# Patient Record
Sex: Male | Born: 1975 | Hispanic: Yes | Marital: Married | Smoking: Never smoker
Health system: Southern US, Community
[De-identification: ages and names within clinical notes are randomized; demographics above are authoritative.]

## PROBLEM LIST (undated history)

## (undated) DIAGNOSIS — E785 Hyperlipidemia, unspecified: Secondary | ICD-10-CM

## (undated) HISTORY — PX: APPENDECTOMY: SHX54

---

## 2004-04-07 ENCOUNTER — Ambulatory Visit: Payer: Self-pay | Admitting: Internal Medicine

## 2006-05-22 ENCOUNTER — Emergency Department: Payer: Self-pay | Admitting: Emergency Medicine

## 2012-04-08 ENCOUNTER — Ambulatory Visit: Payer: Self-pay | Admitting: Emergency Medicine

## 2014-03-25 ENCOUNTER — Emergency Department: Payer: Self-pay | Admitting: Emergency Medicine

## 2014-08-12 ENCOUNTER — Encounter: Payer: Self-pay | Admitting: *Deleted

## 2014-08-12 ENCOUNTER — Other Ambulatory Visit: Payer: Self-pay

## 2014-08-12 ENCOUNTER — Emergency Department
Admission: EM | Admit: 2014-08-12 | Discharge: 2014-08-12 | Disposition: A | Discharge status: Home / Self Care | Payer: Self-pay | Attending: Emergency Medicine | Admitting: Emergency Medicine

## 2014-08-12 ENCOUNTER — Emergency Department: Payer: Self-pay

## 2014-08-12 DIAGNOSIS — Z79899 Other long term (current) drug therapy: Secondary | ICD-10-CM | POA: Insufficient documentation

## 2014-08-12 DIAGNOSIS — R0789 Other chest pain: Secondary | ICD-10-CM | POA: Insufficient documentation

## 2014-08-12 DIAGNOSIS — K219 Gastro-esophageal reflux disease without esophagitis: Secondary | ICD-10-CM | POA: Insufficient documentation

## 2014-08-12 HISTORY — DX: Hyperlipidemia, unspecified: E78.5

## 2014-08-12 LAB — BASIC METABOLIC PANEL
Anion gap: 8 (ref 5–15)
BUN: 14 mg/dL (ref 6–20)
CALCIUM: 9.3 mg/dL (ref 8.9–10.3)
CO2: 25 mmol/L (ref 22–32)
Chloride: 107 mmol/L (ref 101–111)
Creatinine, Ser: 0.8 mg/dL (ref 0.61–1.24)
GFR calc Af Amer: >60 mL/min (ref >60)
GLUCOSE: 113 mg/dL — AB (ref 65–99)
Potassium: 3.9 mmol/L (ref 3.5–5.1)
SODIUM: 140 mmol/L (ref 135–145)

## 2014-08-12 LAB — CBC
HCT: 48.8 % (ref 40.0–52.0)
Hemoglobin: 16.3 g/dL (ref 13.0–18.0)
MCH: 30.9 pg (ref 26.0–34.0)
MCHC: 33.4 g/dL (ref 32.0–36.0)
MCV: 92.4 fL (ref 80.0–100.0)
Platelets: 205 10*3/uL (ref 150–440)
RBC: 5.28 MIL/uL (ref 4.40–5.90)
RDW: 13.6 % (ref 11.5–14.5)
WBC: 7.7 10*3/uL (ref 3.8–10.6)

## 2014-08-12 LAB — TROPONIN I

## 2014-08-12 MED ORDER — RANITIDINE HCL 150 MG PO TABS
ORAL_TABLET | ORAL | Status: DC
Start: 2014-08-12 — End: 2016-10-07

## 2014-08-12 NOTE — ED Notes (Signed)
MD at bedside. 

## 2014-08-12 NOTE — ED Provider Notes (Signed)
[facility] Emergency Department Provider Note  ____________________________________________  Time seen: 54  I have reviewed the triage vital signs and the nursing notes.   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 He began having chest pain in his left chest with pain in the left arm beginning 2 weeks ago. This is slowly been worsening. He feels his left pectoralis muscle is swollen and larger than the right.It hurts to move the arm. He reports that his arm becomes tingly and somewhat numb at night.  The patient and the male with him reports that he also has some signs of indigestion. He has some burping and discomfort through the middle part of the chest.    Past Medical History  Diagnosis Date  . Hyperlipidemia     There are no active problems to display for this patient.   Past Surgical History  Procedure Laterality Date  . Appendectomy      Current Outpatient Rx  Name  Route  Sig  Dispense  Refill  . ibuprofen (ADVIL,MOTRIN) 800 MG tablet   Oral   Take 800 mg by mouth every 8 (eight) hours as needed.         Marland Kitchen omeprazole (PRILOSEC) 10 MG capsule   Oral   Take 10 mg by mouth daily.         . ranitidine (ZANTAC) 150 MG tablet      Take one tablet twice a day for 1 week and then 1 tablet once a day for 2 more weeks. You may continue if needed.   28 tablet   2     Allergies Review of patient's allergies indicates no known allergies.  History reviewed. No pertinent family history.  Social History History  Substance Use Topics  . Smoking status: Never Smoker   . Smokeless tobacco: Not on file  . Alcohol Use: Yes    Review of Systems  Constitutional: Negative for fever. ENT: Negative for sore throat. Cardiovascular: Positive for chest pain. See history of present illness. Respiratory: Negative for shortness of breath. Gastrointestinal: Negative for  abdominal pain, vomiting and diarrhea. Some signs of "indigestion". Genitourinary: Negative for dysuria. Musculoskeletal: Pain in left chest and left arm. Worse with movement and effort. Skin: Negative for rash. Neurological: Negative for headaches   10-point ROS otherwise negative.  ____________________________________________   PHYSICAL EXAM:  VITAL SIGNS: ED Triage Vitals  Enc Vitals Group     BP 08/12/14 0438 130/91 mmHg     Pulse Rate 08/12/14 0438 70     Resp 08/12/14 0438 16     Temp 08/12/14 0438 97.8 F (36.6 C)     Temp Source 08/12/14 0438 Oral     SpO2 08/12/14 0438 95 %     Weight 08/12/14 0438 220 lb (99.791 kg)     Height 08/12/14 0438 5\' 9"  (1.753 m)     Head Cir --      Peak Flow --      Pain Score 08/12/14 0435 5     Pain Loc --      Pain Edu? --      Excl. in GC? --     Constitutional:  Alert and oriented. Well appearing and in no distress. ENT   Head: Normocephalic and atraumatic.   Nose: No congestion/rhinnorhea.   Mouth/Throat: Mucous membranes are moist. Cardiovascular: Normal rate, regular rhythm, no murmur noted Chest  wall: Tenderness to the left pectoralis muscle and the left shoulder. Pain in the left arm with resistance. Respiratory:  Normal respiratory effort, no tachypnea.    Breath sounds are clear and equal bilaterally.  Gastrointestinal: Soft and nontender. No distention.  Back: No muscle spasm, no tenderness, no CVA tenderness. Musculoskeletal: No deformity noted. Pain in the left arm against resistance as well as tenderness in the left chest as noted above. The left pectoralis does appear slightly larger than the right. Neurologic:  Normal speech and language. No gross focal neurologic deficits are appreciated.  Skin:  Skin is warm, dry. No rash noted. Psychiatric: Mood and affect are normal. Speech and behavior are normal.  ____________________________________________    LABS (pertinent positives/negatives)  Labs  Reviewed  BASIC METABOLIC PANEL - Abnormal; Notable for the following:    Glucose, Bld 113 (*)    All other components within normal limits  CBC  TROPONIN I     ____________________________________________   EKG  ED ECG REPORT I, Verna Hamon W, the attending physician, personally viewed and interpreted this ECG.   Date: 08/12/2014  EKG Time: 437  Rate: 62  Rhythm: Normal sinus rhythm  Axis: Normal  Intervals: Normal  ST&T Change: None noted   ____________________________________________    RADIOLOGY  Chest x-ray: No active cardiopulmonary disease.  ____________________________________________   PROCEDURES    ____________________________________________   INITIAL IMPRESSION / ASSESSMENT AND PLAN / ED COURSE  Pertinent labs & imaging results that were available during my care of the patient were reviewed by me and considered in my medical decision making (see chart for details).  Pleasant well-appearing 39 year old male with discomfort in his left chest and left arm. This is been present for 2 weeks. He works in a job that requires significant effort and use of this left arm. I believe the primary discomfort is due to muscle strain. He has been taking ibuprofen only intermittently. I have advised him to take the ibuprofen are more consistent basis, 3 times a day for at least 3 days, then as needed.  The patient and his significant other reports some symptoms of indigestion and reflux. We have discussed treatment of this with ranitidine. We will prescribe this for him.  The patient's troponin level, after 2 weeks of discomfort, is undetectable. His EKG is normal. At 39 years old and a negative history for tobacco use, I have a very low suspicion for cardiac disease, especially given the other findings on exam.  ____________________________________________   FINAL CLINICAL IMPRESSION(S) / ED DIAGNOSES  Final diagnoses:  Left-sided chest wall pain   Gastroesophageal reflux disease without esophagitis      Darien Ramus, MD 08/12/14 [phone]

## 2014-08-12 NOTE — ED Notes (Signed)
Pt reports left-sided chest pain x2 weeks, described as burning, now radiating down left arm and causing left arm numbness. Pt denies any shortness of breath at present - no acute distress noted.

## 2014-08-12 NOTE — Discharge Instructions (Signed)
Dolor de pecho (no específico) °(Chest Pain (Nonspecific)) °Suele ser difícil diagnosticar la causa del dolor de pecho. Siempre hay una posibilidad de que el dolor podría estar relacionado con algo grave, como un ataque al corazón o un coágulo sanguíneo en los pulmones. Debe concurrir a las visitas de control con el médico. °CUIDADOS EN EL HOGAR °· Si le dieron antibióticos, tómelos como se lo haya indicado el médico. Finalice el medicamento, aunque comience a sentirse mejor. °· Durante los días siguientes, no haga actividades que provoquen dolor de pecho. Continúe con las actividades físicas como se lo haya indicado el médico. °· No use productos que contengan tabaco, que incluyen cigarrillos, tabaco para mascar y cigarrillos electrónicos. °· Evite el consumo de alcohol. °· Tome los medicamentos solamente como se lo haya indicado el médico. °· Siga las sugerencias del médico en lo que respecta a más pruebas, si el dolor de pecho no desaparece. °· Concurra a todas las visitas que concertó con el médico. °SOLICITE AYUDA SI: °· El dolor de pecho no desaparece, incluso después del tratamiento. °· Tiene una erupción cutánea con ampollas en el pecho. °· Tiene fiebre. °SOLICITE AYUDA DE INMEDIATO SI:  °· Aumenta el dolor de pecho o el dolor se irradia hacia el brazo, el cuello, la mandíbula, la espalda o el vientre (abdomen). °· Le falta el aire. °· Tose más de lo normal o tose con sangre. °· Siente un dolor muy intenso en la espalda o el vientre. °· Tiene malestar estomacal (náuseas) o vomita. °· Se siente muy débil. °· Pierde el conocimiento (se desmaya). °· Tiene escalofríos. °Esto es una emergencia. No espere a ver que los problemas desaparezcan. Llame a los servicios de emergencia locales (911 en los [place]). No conduzca por sus propios medios hasta el hospital. °ASEGÚRESE DE QUE:  °· Comprende estas instrucciones. °· Controlará su afección. °· Recibirá ayuda de inmediato si no mejora o si empeora. °Document  Released: 03/24/2008 Document Revised: 12/31/2012 °ExitCare® Patient Information ©2015 ExitCare, LLC. This information is not intended to replace advice given to you by your health care provider. Make sure you discuss any questions you have with your health care provider. ° °

## 2015-04-18 ENCOUNTER — Emergency Department: Payer: Self-pay

## 2015-04-18 ENCOUNTER — Emergency Department
Admission: EM | Admit: 2015-04-18 | Discharge: 2015-04-18 | Disposition: A | Discharge status: Home / Self Care | Payer: Self-pay | Attending: Emergency Medicine | Admitting: Emergency Medicine

## 2015-04-18 DIAGNOSIS — R200 Anesthesia of skin: Secondary | ICD-10-CM | POA: Insufficient documentation

## 2015-04-18 DIAGNOSIS — E785 Hyperlipidemia, unspecified: Secondary | ICD-10-CM | POA: Insufficient documentation

## 2015-04-18 MED ORDER — PREDNISONE 20 MG PO TABS: 60.0000 mg | ORAL_TABLET | Freq: Every day | ORAL | Status: DC

## 2015-04-18 NOTE — ED Provider Notes (Signed)
[facility] Emergency Department Provider Note   ____________________________________________  Time seen: ~1750  I have reviewed the triage vital signs and the nursing notes.   HISTORY  Chief Complaint Numbness   History limited by: Not Limited   HPI Austin Harper is a 40 y.o. male who presents to the emergency department today because of concerns for left facial numbness. The patient states that he has had Bell's probably on the right side for roughly 4-5 years. He never went to see a doctor for this. He states that his wife told him what it was and there is nothing a doctor could do about. He states the symptoms have persisted for that long. He states that he now feels like his left face is going numb.He denies any trauma or recent illnesses.   Past Medical History  Diagnosis Date  . Hyperlipidemia     There are no active problems to display for this patient.   Past Surgical History  Procedure Laterality Date  . Appendectomy      Current Outpatient Rx  Name  Route  Sig  Dispense  Refill  . ibuprofen (ADVIL,MOTRIN) 800 MG tablet   Oral   Take 800 mg by mouth every 8 (eight) hours as needed.         Marland Kitchen omeprazole (PRILOSEC) 10 MG capsule   Oral   Take 10 mg by mouth daily.         . ranitidine (ZANTAC) 150 MG tablet      Take one tablet twice a day for 1 week and then 1 tablet once a day for 2 more weeks. You may continue if needed.   28 tablet   2     Allergies Review of patient's allergies indicates no known allergies.  No family history on file.  Social History Social History  Substance Use Topics  . Smoking status: Never Smoker   . Smokeless tobacco: None  . Alcohol Use: Yes    Review of Systems  Constitutional: Negative for fever. Cardiovascular: Negative for chest pain. Respiratory: Negative for shortness of breath. Gastrointestinal: Negative for abdominal pain, vomiting and diarrhea. Neurological:  Positive for left sided numbness  10-point ROS otherwise negative.  ____________________________________________   PHYSICAL EXAM:  VITAL SIGNS: ED Triage Vitals  Enc Vitals Group     BP 04/18/15 1626 128/82 mmHg     Pulse Rate 04/18/15 1626 112     Resp 04/18/15 1626 16     Temp 04/18/15 1626 98 F (36.7 C)     Temp src --      SpO2 04/18/15 1626 95 %     Weight 04/18/15 1626 208 lb (94.348 kg)     Height 04/18/15 1626  (1.803 m)     Head Cir --      Peak Flow --      Pain Score 04/18/15 1627 5   Constitutional: Alert and oriented. Well appearing and in no distress. Eyes: Conjunctivae are normal. PERRL. Normal extraocular movements. ENT   Head: Normocephalic and atraumatic.   Nose: No congestion/rhinnorhea.   Mouth/Throat: Mucous membranes are moist.   Neck: No stridor. Hematological/Lymphatic/Immunilogical: No cervical lymphadenopathy. Cardiovascular: Normal rate, regular rhythm.  No murmurs, rubs, or gallops. Respiratory: Normal respiratory effort without tachypnea nor retractions. Breath sounds are clear and equal bilaterally. No wheezes/rales/rhonchi. Gastrointestinal: Soft and nontender. No distention. There is no CVA tenderness. Genitourinary: Deferred Musculoskeletal: Normal range of motion in all extremities. No joint effusions.  No lower  extremity tenderness nor edema. Neurologic:  Normal speech and language. Tongue symmetric. Decreased strength of bilateral upper eyebrows. Somewhat hard to appreciate symmetry issues given now chronic left sided weakness. Strength 5/5 in upper and lower extremities.  Skin:  Skin is warm, dry and intact. No rash noted. Psychiatric: Mood and affect are normal. Speech and behavior are normal. Patient exhibits appropriate insight and judgment.  ____________________________________________    LABS (pertinent  positives/negatives)  None  ____________________________________________   EKG  None  ____________________________________________    RADIOLOGY  CT head IMPRESSION: No acute abnormality noted.  ____________________________________________   PROCEDURES  Procedure(s) performed: None  Critical Care performed: No  ____________________________________________   INITIAL IMPRESSION / ASSESSMENT AND PLAN / ED COURSE  Pertinent labs & imaging results that were available during my care of the patient were reviewed by me and considered in my medical decision making (see chart for details).  Patient presents to the emergency department today because of concerns for left facial numbness. 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 Exam is consistent with Bell's palsy on the right and possibly Bell's palsy on the left. CT did not show any concerning findings. Will plan on giving steroids. Follow up with neurology.   ____________________________________________   FINAL CLINICAL IMPRESSION(S) / ED DIAGNOSES  Final diagnoses:  Facial numbness     Phineas SemenGraydon Jaiyla Granados, MD 04/18/15 2004

## 2015-04-18 NOTE — ED Notes (Signed)
States pain in the left neck/ back of head that "feels like a pulling" and c/o dry mouth

## 2015-04-18 NOTE — ED Notes (Signed)
States has rt side face problems x 5 years and was told it was a virus that may or may not get better (bell's palsy) - but now thinks it may be moving to the left side and that his left face now feels numb as well

## 2015-04-18 NOTE — ED Notes (Signed)
Per pt he never actually went to a doctor 5 years ago it was his wife who told him they couldn't do anything for him.

## 2015-04-18 NOTE — Discharge Instructions (Signed)
Please seek medical attention for any high fevers, chest pain, shortness of breath, change in behavior, persistent vomiting, bloody stool or any other new or concerning symptoms.  Parlisis facial (Bell Palsy) La parlisis facial es una afeccin en la que se paralizan los msculos de un lado de la cara. Esto a menudo provoca la cada de un lado de la cara. Es una afeccin frecuente y Games developerla mayora de las personas se recuperan por completo. FACTORES DE RIESGO Los factores de riesgo de la parlisis facial incluyen:  Psychiatristmbarazo.  Diabetes.  Una infeccin provocada por un virus, como las infecciones que causan llagas peribucales. CAUSAS  La parlisis facial es ocasionada por un dao o una inflamacin de un nervio de la cara. No est claro por qu sucede, pero puede ocurrir a causa de una infeccin provocada por un virus. La mayora de las veces se desconoce la causa. Blake DivineSIGNOS Y SNTOMAS  Los sntomas pueden variar de leves a graves y pueden durar varias horas. Entre los sntomas se pueden incluir los siguientes:  No poder Education officer, environmentalrealizar lo siguiente:  Surveyor, miningLevantar una o las dos cejas.  Cerrar Walgreenuno o los dos ojos.  Sentir partes de la cara (entumecimiento facial).  Cada del prpado y la comisura de la boca.  Debilidad en la cara.  Parlisis de la mitad de la cara.  Prdida del gusto.  Sensibilidad a los ruidos fuertes.  Dificultad para Product managermasticar.  Lagrimeo del ojo afectado.  Sequedad del ojo afectado.  Babeo.  Dolor detrs de Fiservuna oreja. DIAGNSTICO  El diagnstico de la parlisis facial puede incluir:  Un examen fsico y Neomia Dearuna historia clnica.  Una resonancia magntica.  Una tomografa computarizada.  Electromiograma (EMG). Esta prueba se realiza para controlar el funcionamiento de los nervios. TRATAMIENTO  El tratamiento puede incluir medicamentos antivirales para ayudar a reducir la duracin de Astronomerla afeccin. En ocasiones, el tratamiento no es necesario y los sntomas desaparecen por  s solos. INSTRUCCIONES PARA EL CUIDADO EN EL HOGAR   Tome los medicamentos solamente como se lo haya indicado el mdico.  Hgase masajes y realice los ejercicios faciales, como se lo haya indicado el mdico.  Si el ojo est afectado:  Use gotas oftlmicas con efecto hidratante para prevenir la sequedad del ojo, como se lo haya indicado el mdico.  Proteja el ojo como se lo haya indicado el mdico. SOLICITE ATENCIN MDICA SI:  Los sntomas no mejoran o empeoran.  Babea.  El ojo est rojo, irritado o le duele. SOLICITE ATENCIN MDICA DE INMEDIATO SI:   Siente otra parte del cuerpo dbil o adormecida.  Tiene dificultad para tragar.  Tiene fiebre adems de los sntomas de la parlisis facial.  Siente dolor en el cuello. ASEGRESE DE QUE:   Comprende estas instrucciones.  Controlar su afeccin.  Recibir ayuda de inmediato si no mejora o si empeora.   Esta informacin no tiene Theme park managercomo fin reemplazar el consejo del mdico. Asegrese de hacerle al mdico cualquier pregunta que tenga.   Document Released: 12/26/2004 Document Revised: 09/16/2014 Elsevier Interactive Patient Education [employer]2016 Elsevier Inc.

## 2016-10-07 ENCOUNTER — Encounter: Payer: Self-pay | Admitting: Family Medicine

## 2016-10-07 ENCOUNTER — Ambulatory Visit (INDEPENDENT_AMBULATORY_CARE_PROVIDER_SITE_OTHER): Payer: Self-pay | Admitting: Family Medicine

## 2016-10-07 VITALS — BP 120/80 | HR 75 | Temp 98.2°F | Resp 18 | Ht 67.75 in | Wt 222.0 lb

## 2016-10-07 DIAGNOSIS — Z23 Encounter for immunization: Secondary | ICD-10-CM

## 2016-10-07 DIAGNOSIS — M7052 Other bursitis of knee, left knee: Secondary | ICD-10-CM

## 2016-10-07 NOTE — Patient Instructions (Addendum)
IF you received an x-ray today, you will receive an invoice from [facility]. Please contact [facility] at [phone] with questions or concerns regarding your invoice.   IF you received labwork today, you will receive an invoice from [place]. Please contact [facility] at [phone] with questions or concerns regarding your invoice.   Our billing staff will not be able to assist you with questions regarding bills from these companies.  You will be contacted with the lab results as soon as they are available. The fastest way to get your results is to activate your My Chart account. Instructions are located on the last page of this paperwork. If you have not heard from [place] regarding the results in 2 weeks, please contact this office.     Bursitis de la pata de ganso con rehabilitacin (Pes Anserine Bursitis With Rehab) La pata de ganso es una zona interna de la rodilla, debajo de la articulacin, que est amortiguada por un saco lleno de lquido [place]). La bursitis de la pata de ganso es una afeccin que se produce cuando esta bolsa se hincha y se irrita. La afeccin causa dolor de rodilla. CAUSAS Esta afeccin puede ser causada por lo siguiente:  Realizar un mismo movimiento una y [place].  Un golpe en la parte interna de la pierna. FACTORES DE RIESGO Es ms probable que esta afeccin se manifieste en:  Corredores.  Atletas que practican deportes en los que se debe correr y Multimedia programmer de direccin sbitamente.  Atletas que practican deportes de contacto.  Personas que nadan utilizando un ngulo interno de la rodilla, por ejemplo, con estilo pecho.  Personas con tensin en los msculos isquiotibiales.  Mujeres.  Personas que tienen sobrepeso.  Personas con pie plano.  Personas con diabetes o artrosis. SNTOMAS Los sntomas de esta afeccin incluyen lo siguiente:  Dolor de rodilla que se alivia con el reposo y [place] con actividades como subir  escaleras, Advertising account planner, correr o sentarse y levantarse de la silla (frecuente).  Hinchazn.  Calor.  Dolor a la palpacin al presionar en la parte inferior interna de la pierna, debajo de la rodilla. DIAGNSTICO Esta afeccin se puede diagnosticar en funcin de lo siguiente:  Sus sntomas.  Sus antecedentes mdicos.  Un examen fsico.  Estudios, por ejemplo: ? Radiografas. ? Una resonancia magntica y Sherlyn Lees. Estos estudios se realizan para Engineer, manufacturing hinchazn y acumulacin de lquido en la bolsa, y para examinar los msculos y los tendones. Durante el examen fsico, el mdico har presin sobre la unin del tendn para ver si Electronics engineer. Tambin puede examinar el movimiento y la fuerza de la rodilla y la cadera. TRATAMIENTO El tratamiento de esta afeccin puede incluir lo siguiente:  Pharmacologist la rodilla en reposo.  Evitar las actividades que causen [employer].  Aplicar hielo en la parte interna de la rodilla.  Levantar (elevar) la rodilla al descansar.  Dormir con [employer]. Esto amortiguar la Arts administrator.  Tomar medicamentos para Engineer, materials y reducir la hinchazn.  Aplicarse medicamentos inyectables en la rodilla.  Hacer ejercicios de elongacin y fortalecimiento (fisioterapia). Si estos tratamientos no funcionan o si la afeccin sigue apareciendo, es posible que necesite una ciruga para Agricultural engineer. INSTRUCCIONES PARA EL CUIDADO EN EL HOGAR Control del dolor, la rigidez y la hinchazn  Si se lo indicaron, aplique hielo sobre la rodilla. ? Ponga el hielo en una bolsa plstica. ? Coloque una [employer] piel y la bolsa de  hielo. ? Coloque el hielo durante , 2 a 3veces por da.  Levante la rodilla mientras est sentado.  Cuando est acostado, eleve la rodilla por encima del nivel del corazn.  Tome los medicamentos de venta libre y los recetados solamente como se lo haya indicado el mdico. Actividad  Retome  sus actividades normales como se lo haya indicado el mdico. Pregntele al mdico qu actividades son seguras para usted.  Haga ejercicios como se lo haya indicado el mdico. Instrucciones generales  Duerma con una almohada entre las rodillas.  No consuma ningn producto que contenga tabaco, lo que incluye cigarrillos, tabaco de Theatre manager y Administrator, Civil Service. El tabaco puede retrasar la cicatrizacin. Si necesita ayuda para dejar de fumar, consulte al mdico.  Concurra a todas las visitas de control como se lo haya indicado el mdico. Esto es importante. PREVENCIN  Precaliente y elongue adecuadamente antes de la [place].  Reljese y elongue despus de realizar [place].  Dele a su cuerpo tiempo para [employer] perodos de [place].  Asegrese de usar el equipo que sea apto para usted.  Tome medidas de seguridad y sea responsable al hacer Ether Griffins, para evitar las cadas.  Haga por lo menos de ejercicios de intensidad moderada cada semana, como caminar a paso ligero o hacer gimnasia acutica.  Mantenga un buen estado fsico, esto incluye lo siguiente: ? La fuerza. ? La flexibilidad. ? La capacidad cardiovascular. ? La resistencia. SOLICITE ATENCIN MDICA SI:  Los sntomas no mejoran.  Los sntomas empeoran. Esta informacin no tiene Theme park manager el consejo del mdico. Asegrese de hacerle al mdico cualquier pregunta que tenga. Document Released: 10/12/2005 Document Revised: 05/12/2014 Document Reviewed: 12/11/2014 Elsevier Interactive Patient Education  [employer].

## 2016-10-07 NOTE — Progress Notes (Signed)
   9/29/201812:29 PM  Austin Harper 1975-11-02, 41 y.o. male [id number]  Chief Complaint  Patient presents with  . Edema    Right knee, No injury, Patient works doing floors,     HPI:   Patient is a 41 y.o. male who presents today for 2 weeks of right knee swelling. He denies any trauma or past injuries or surgeries. He denies any redness, occ mild warmth, he states the swelling gets better and worse. He works on his knees all day lying floors. He does wear knee pads. He says low dose ibuprofen helps. Has not been doing anything else for it. This has never happened before. He denies any fevers or chills.  Depression screen PHQ 2/9 10/07/2016  Decreased Interest 0  Down, Depressed, Hopeless 0  PHQ - 2 Score 0    No Known Allergies  No current outpatient prescriptions on file prior to visit.   No current facility-administered medications on file prior to visit.     Past Medical History:  Diagnosis Date  . Hyperlipidemia     Past Surgical History:  Procedure Laterality Date  . APPENDECTOMY      Social History  Substance Use Topics  . Smoking status: Never Smoker  . Smokeless tobacco: Never Used  . Alcohol use Yes    History reviewed. No pertinent family history.  ROS Per hpi  OBJECTIVE:  Blood pressure 120/80, pulse 75, temperature 98.2 F (36.8 C), temperature source Oral, resp. rate 18, height 5' 7.75" (1.721 m), weight 222 lb (100.7 kg), SpO2 97 %.  Physical Exam GEN AAOx3, NAD Right knee: FROM, discrete swelling over infrapatellar bursa, No erythema or warmth, minimal TTP.  No results found for this or any previous visit (from the past 24 hour(s)).  No results found.   ASSESSMENT and PLAN  1. Infrapatellar bursitis of left knee Discussed conservative measures, work excuse for 3 days given to allow ice, rest and elevation. Continue with NSAID use prn. Patient educational hand out given. RTC precautions given.  2. Needs flu shot Given  today - Flu Vaccine QUAD 36+ mos IM   Return if symptoms worsen or fail to improve.    Myles Lipps, MD Primary Care at [facility] [address] [place], [place] [address] Ph.  [phone] Fax [phone]

## 2017-04-07 IMAGING — CR DG CHEST 2V
1 series · 2 of 2 positions shown · non-contrast
Comparison: 03/25/2014

CLINICAL DATA: Mid the left chest pain for 1 week.

EXAM:
CHEST  2 VIEW

[Series 1: w chest pa · 0.14mm/px · 2 of 2 slices shown]
[im 1/2]
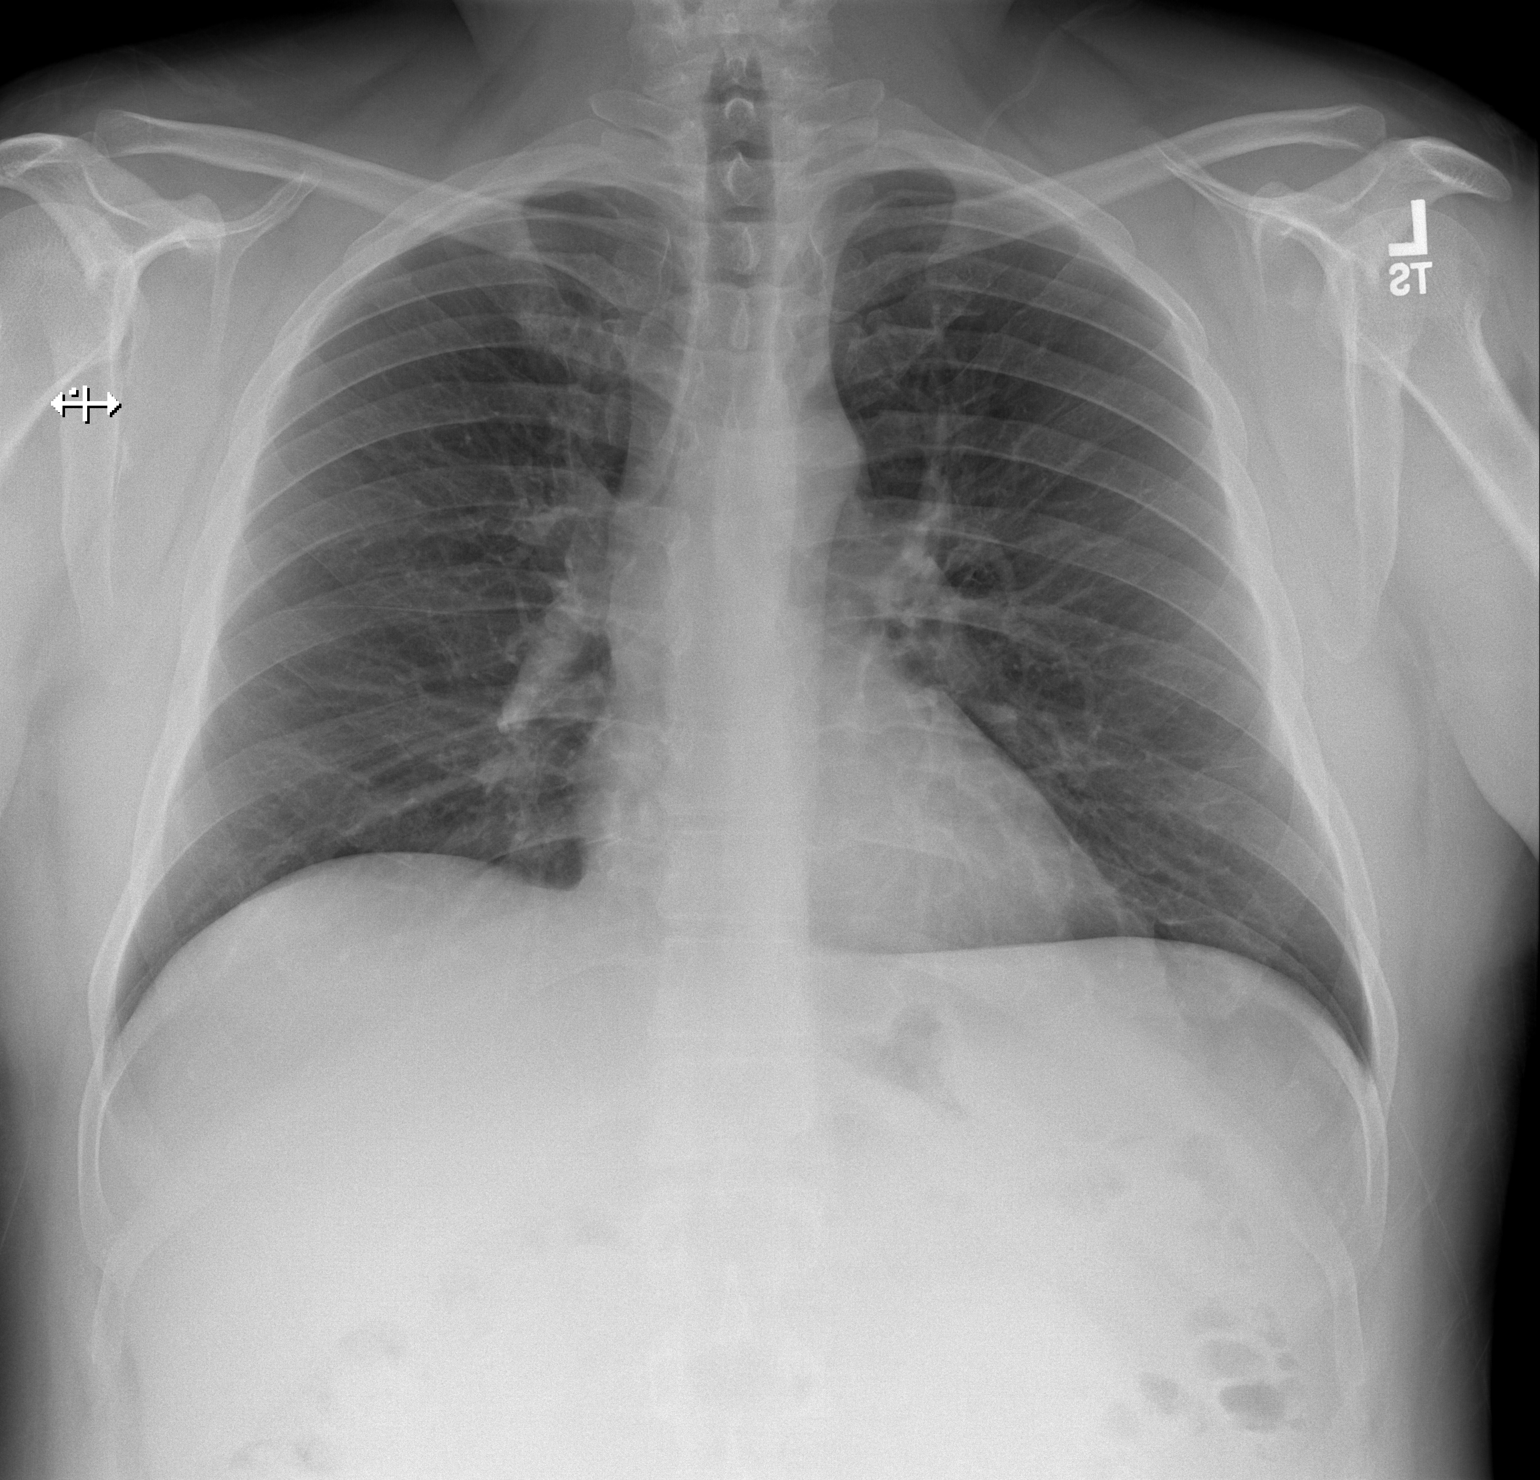
[im 2/2]
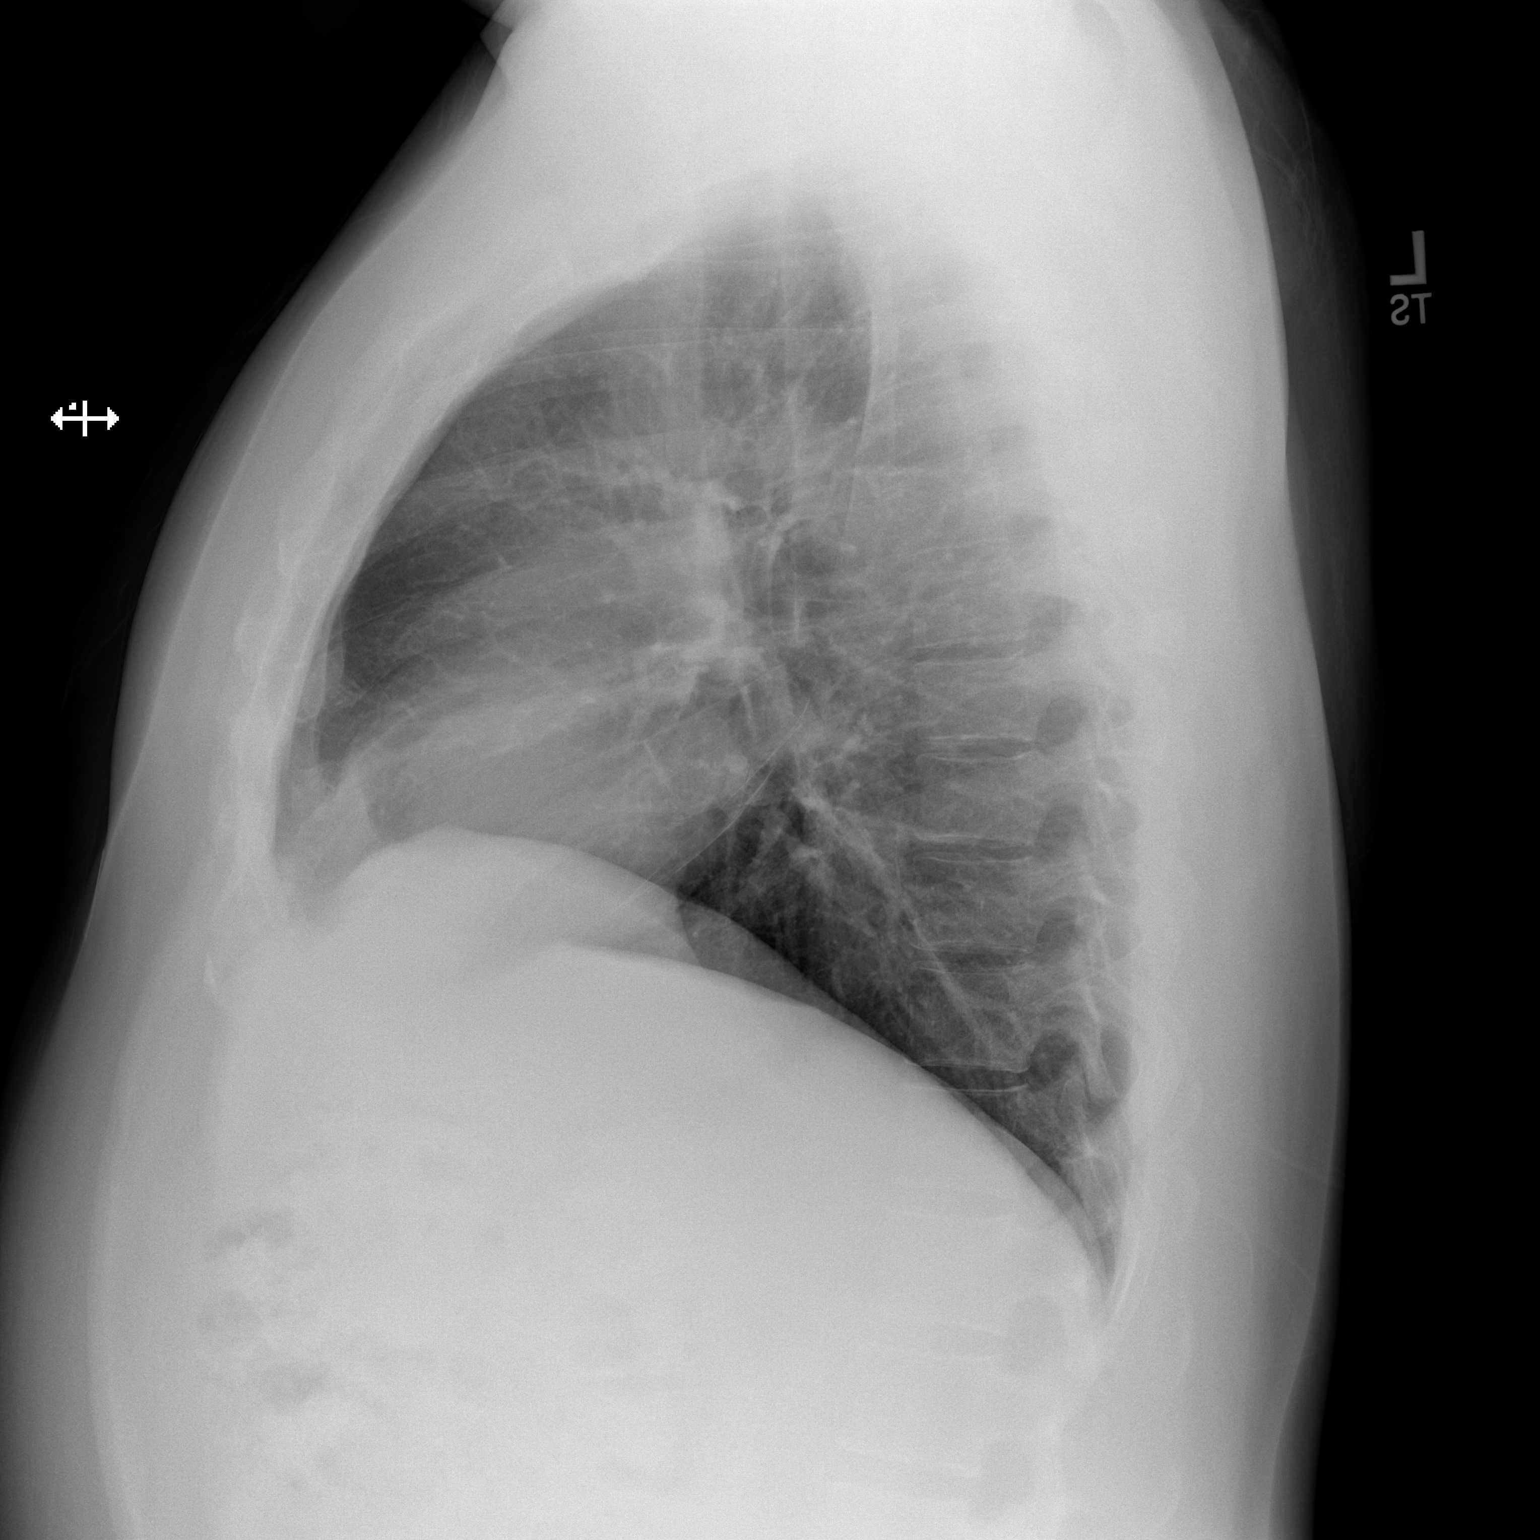

[2 of 2 positions shown; findings below may reference images not displayed]

FINDINGS: The heart size and mediastinal contours are within normal limits.
Both lungs are clear. The visualized skeletal structures are
unremarkable.
IMPRESSION: No active cardiopulmonary disease.

## 2017-12-12 IMAGING — CT CT HEAD W/O CM
1 series · 16 of 30 positions shown, 20 images · non-contrast
Comparison: None.

CLINICAL DATA: Facial numbness

EXAM:
CT HEAD WITHOUT CONTRAST
TECHNIQUE: Contiguous axial images were obtained from the base of the skull
through the vertex without intravenous contrast.

[Series 2: head wo · axial · 0.42mm/px · z∈[+544,+688]mm · 16 of 36 slices shown, 20 images]
[im 2/36  brain]
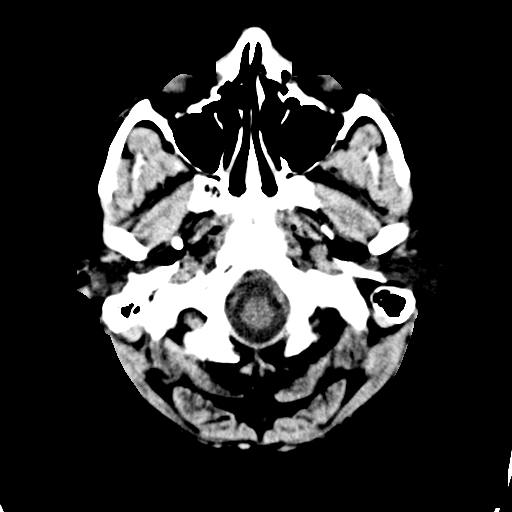
[im 2/36  bone]
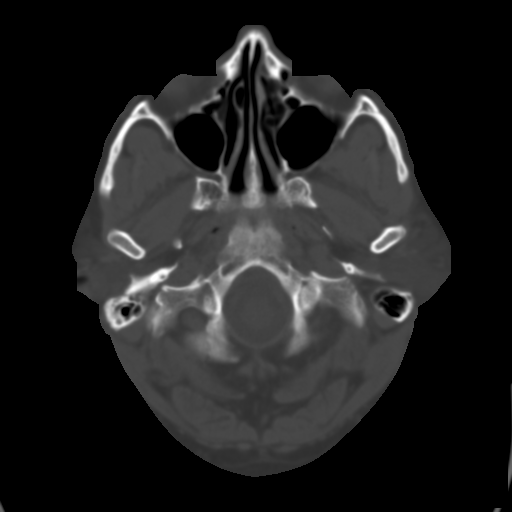
[im 4/36  brain]
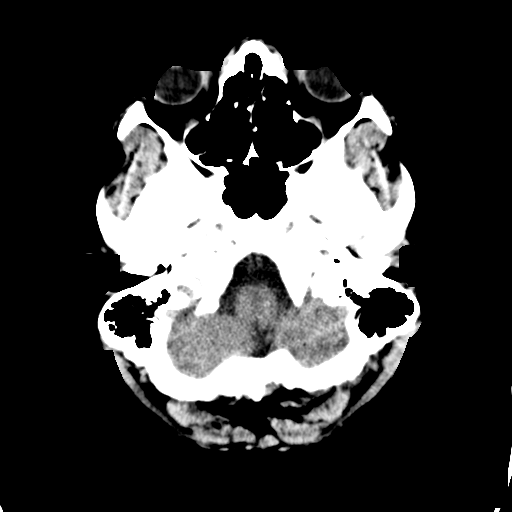
[im 7/36  brain]
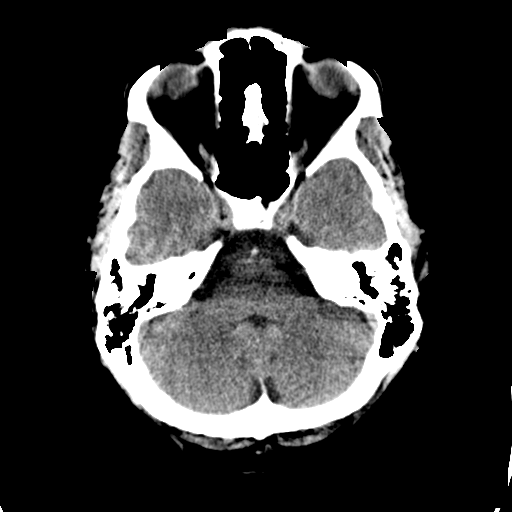
[im 9/36  brain]
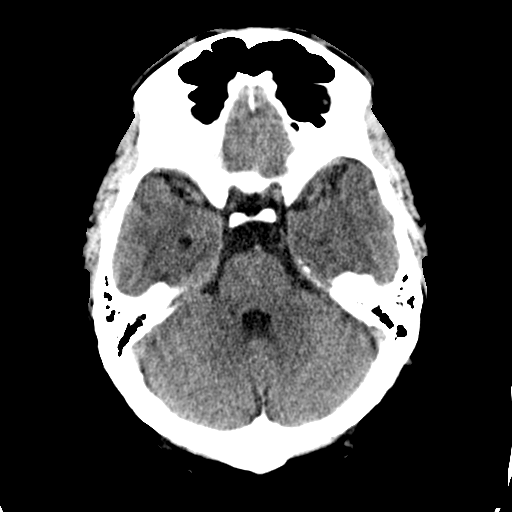
[im 10/36  brain]
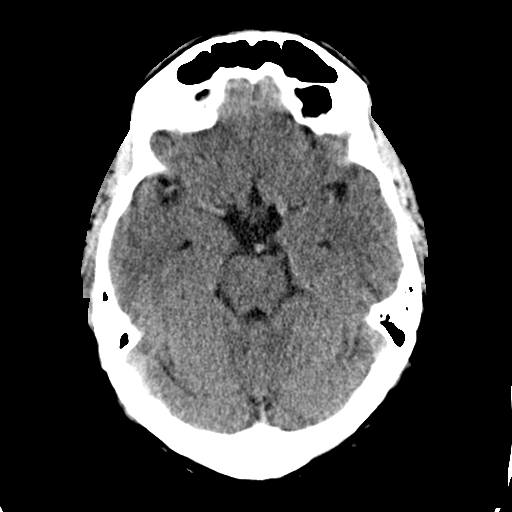
[im 10/36  bone]
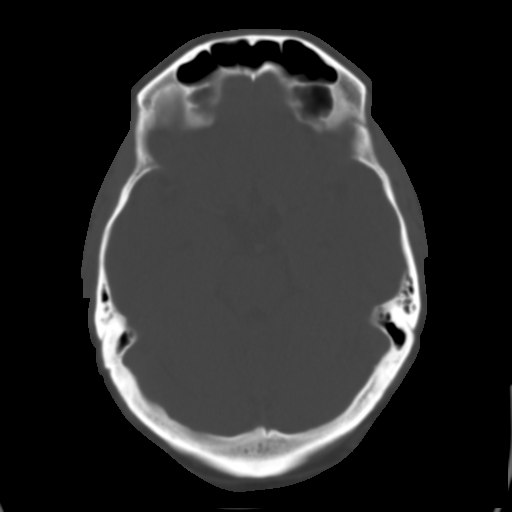
[im 13/36  brain]
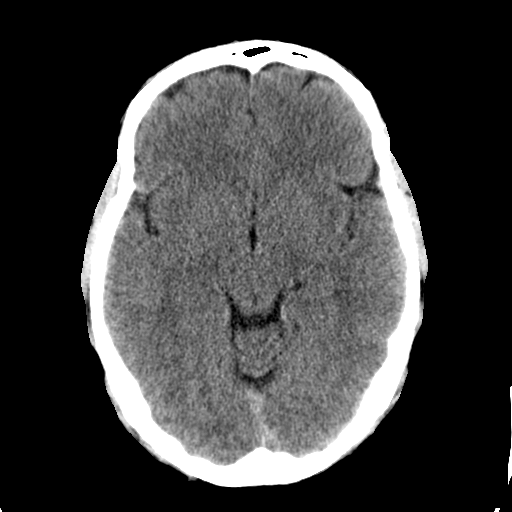
[im 15/36  brain]
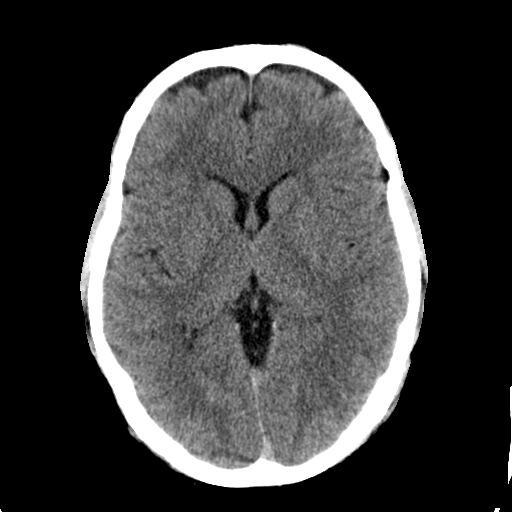
[im 17/36  brain]
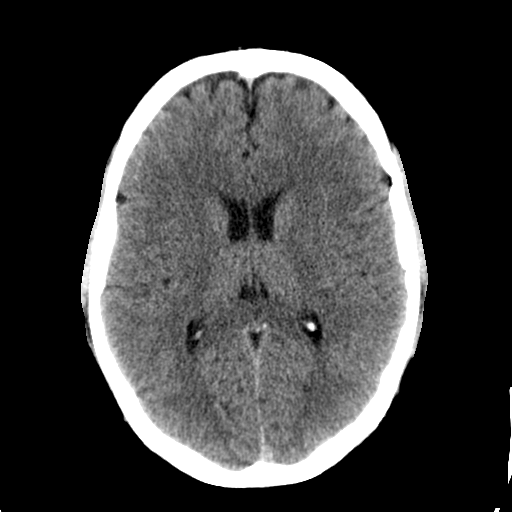
[im 19/36  brain]
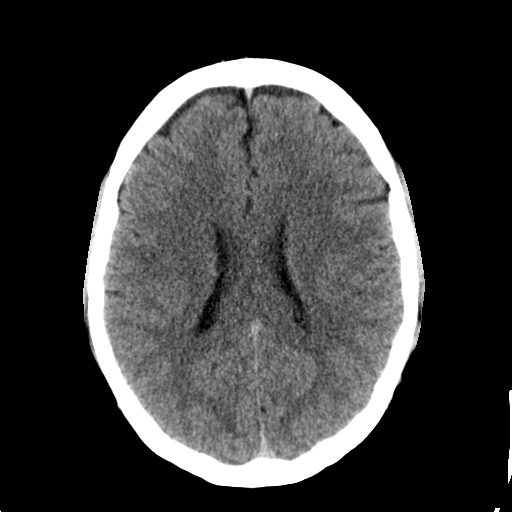
[im 19/36  bone]
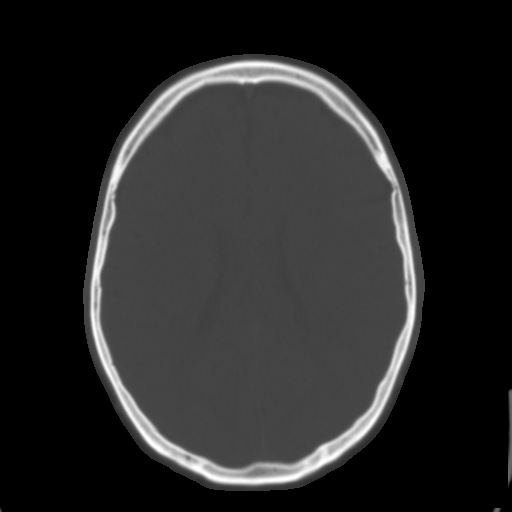
[im 21/36  brain]
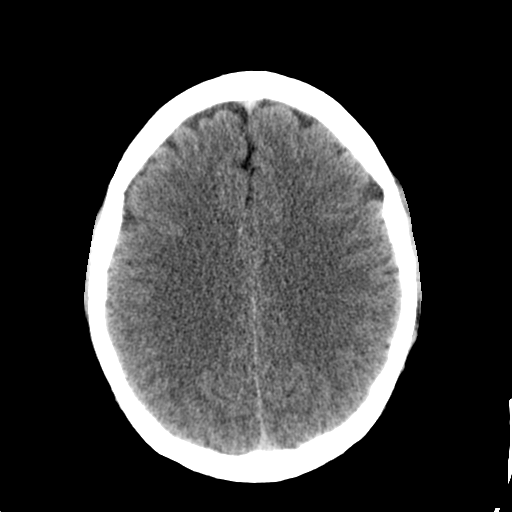
[im 23/36  brain]
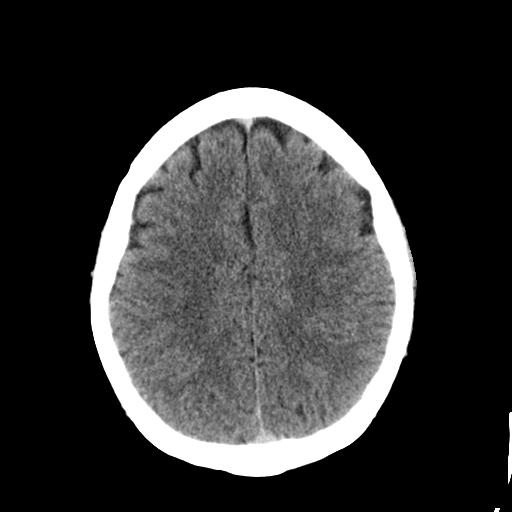
[im 26/36  brain]
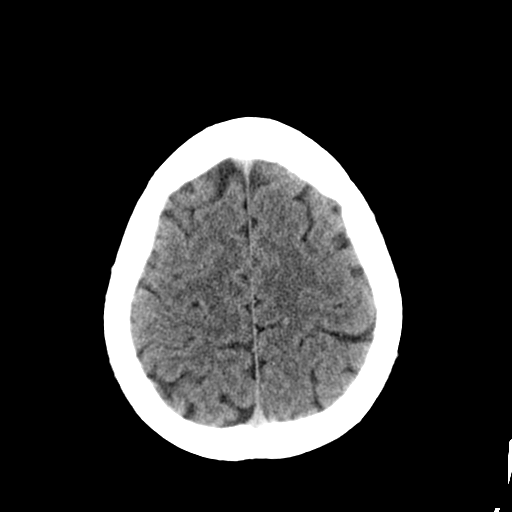
[im 27/36  brain]
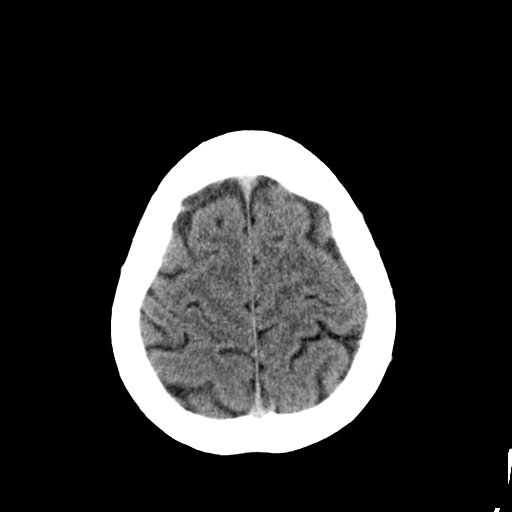
[im 27/36  bone]
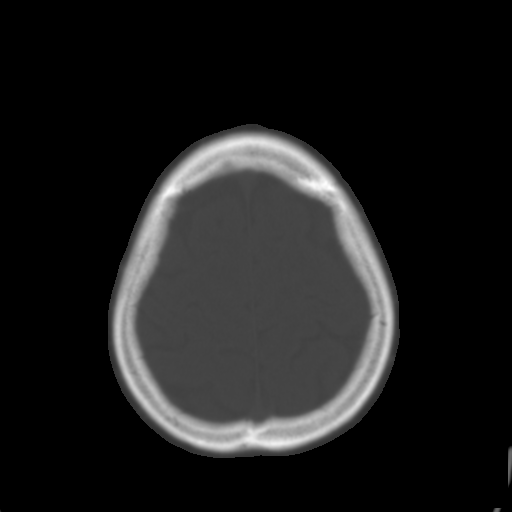
[im 29/36  brain]
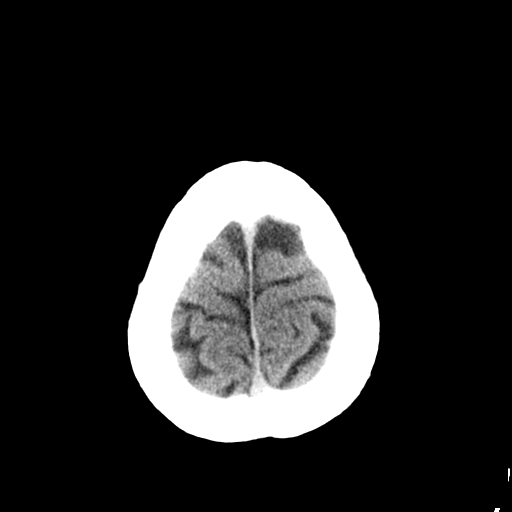
[im 32/36  brain]
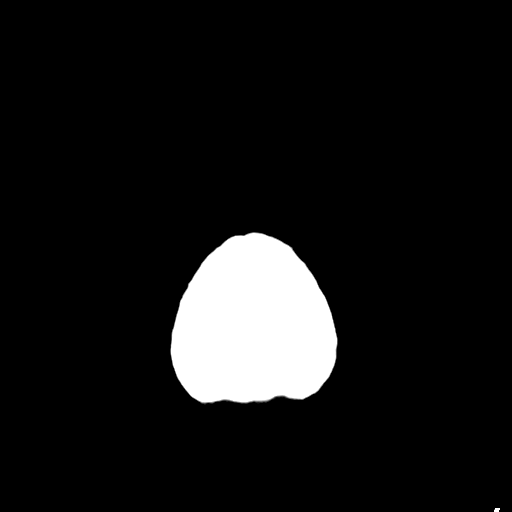
[im 34/36  brain]
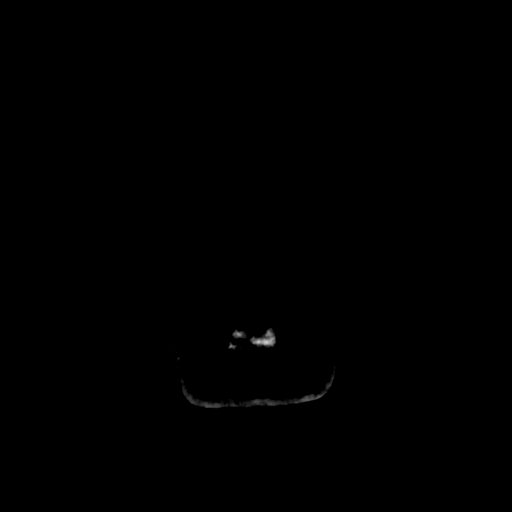

[16 of 30 positions shown; findings below may reference images not displayed]

FINDINGS: Bony calvarium is intact. No gross soft tissue abnormality is noted.
No findings to suggest acute hemorrhage, acute infarction or
space-occupying mass lesion are noted.
IMPRESSION: No acute abnormality noted.

## 2019-02-07 ENCOUNTER — Other Ambulatory Visit: Payer: Self-pay

## 2019-02-07 ENCOUNTER — Ambulatory Visit
Admission: EM | Admit: 2019-02-07 | Discharge: 2019-02-07 | Disposition: A | Discharge status: Home / Self Care | Payer: Self-pay | Attending: Family Medicine | Admitting: Family Medicine

## 2019-02-07 DIAGNOSIS — M5442 Lumbago with sciatica, left side: Secondary | ICD-10-CM

## 2019-02-07 DIAGNOSIS — M545 Low back pain: Secondary | ICD-10-CM

## 2019-02-07 MED ORDER — CYCLOBENZAPRINE HCL 10 MG PO TABS: 10.0000 mg | ORAL_TABLET | Freq: Two times a day (BID) | ORAL | 0 refills | Status: AC | PRN

## 2019-02-07 MED ORDER — PREDNISONE 10 MG PO TABS: ORAL_TABLET | ORAL | 0 refills | Status: AC

## 2019-02-07 NOTE — Discharge Instructions (Addendum)
Take medication as prescribed. Rest. Drink plenty of fluids. Ice/Heat.  Follow up with your primary care physician this week as needed. Return to Urgent care for new or worsening concerns.   

## 2019-02-07 NOTE — ED Provider Notes (Signed)
[facility] ____________________________________________  Time seen: Approximately 11:47 AM  I have reviewed the triage vital signs and the nursing notes.   HISTORY  Chief Complaint Back Pain   HPI Austin Harper is a 44 y.o. male presenting for evaluation of left lower back pain that is been present for the last 3 days.  Patient reports he has a history of similar occurring in the past, particularly after overworking.  States he had a disc issue approximately 12 years ago.  Denies any fall, direct injury or direct trauma.  Denies urinary or bowel retention or incontinence.  Denies abdominal pain.  Does intermittently have some pain radiation down his left leg he describes as burning.  States low back pain is sharp and catching particularly with movement.  Unresolved with over-the-counter ibuprofen.  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  Denies any numbness, loss of sensation or loss of range of motion.  Reports otherwise doing well.  No recent sickness.    Past Medical History:  Diagnosis Date  . Hyperlipidemia     There are no problems to display for this patient.   Past Surgical History:  Procedure Laterality Date  . APPENDECTOMY       No current facility-administered medications for this encounter.  Current Outpatient Medications:  .  cyclobenzaprine (FLEXERIL) 10 MG tablet, Take 1 tablet (10 mg total) by mouth 2 (two) times daily as needed for muscle spasms. Do not drive while taking as can cause drowsiness, Disp: 15 tablet, Rfl: 0 .  ibuprofen (ADVIL,MOTRIN) 200 MG tablet, Take 200 mg by mouth every 6 (six) hours as needed., Disp: , Rfl:  .  predniSONE (DELTASONE) 10 MG tablet, Start 60 mg po day one, then 50 mg po day two, taper by 10 mg daily until complete., Disp: 21 tablet, Rfl: 0  Allergies Patient has no known allergies.  Family History  Problem Relation Age of Onset  . Diabetes Mother   . Cancer Father      Social History Social History   Tobacco Use  . Smoking status: Never Smoker  . Smokeless tobacco: Never Used  Substance Use Topics  . Alcohol use: Yes  . Drug use: No    Review of Systems Constitutional: No fever ENT: No sore throat. Cardiovascular: Denies chest pain. Respiratory: Denies shortness of breath. Gastrointestinal: No abdominal pain.  No nausea, no vomiting.  No diarrhea.  No constipation. Genitourinary: Negative for dysuria. Musculoskeletal: Positive for back pain. Skin: Negative for rash.   ____________________________________________   PHYSICAL EXAM:  VITAL SIGNS: ED Triage Vitals  Enc Vitals Group     BP 02/07/19 1127 (!) 129/98     Pulse Rate 02/07/19 1127 69     Resp -- 18     Temp 02/07/19 1127 98.3 F (36.8 C)     Temp src --      SpO2 02/07/19 1127 98 %     Weight 02/07/19 1125 220 lb (99.8 kg)     Height 02/07/19 1125 5\' 8"  (1.727 m)     Head Circumference --      Peak Flow --      Pain Score 02/07/19 1124 8     Pain Loc --      Pain Edu? --      Excl. in [place]? --     Constitutional: Alert and oriented. Well appearing and in no acute distress. Eyes: Conjunctivae are normal. ENT      Head: Normocephalic and  atraumatic. Cardiovascular: Normal rate, regular rhythm. Grossly normal heart sounds.  Good peripheral circulation. Respiratory: Normal respiratory effort without tachypnea nor retractions. Breath sounds are clear and equal bilaterally. No wheezes, rales, rhonchi. Gastrointestinal: Soft and nontender.No CVA tenderness. Musculoskeletal:  No midline cervical or thoracic tenderness to palpation. Bilateral pedal pulses equal and easily palpated.   Except: Minimal midline lower lumbar tenderness palpation, no right paralumbar tenderness, positive left lumbar tenderness and tenderness over greater sciatic notch, no swelling, no ecchymosis, pain increases with left lumbar rotation as well as lumbar flexion and extension, good range of motion  present, no pain with standing bilateral knee lifts, no saddle anesthesia.  Bilateral plantar flexion dorsiflexion strong and equal.  Steady gait. Neurologic:  Normal speech and language. No gross focal neurologic deficits are appreciated. Speech is normal. No gait instability.  Skin:  Skin is warm, dry and intact. No rash noted. Psychiatric: Mood and affect are normal. Speech and behavior are normal. Patient exhibits appropriate insight and judgment   ___________________________________________   LABS (all labs ordered are listed, but only abnormal results are displayed)  Labs Reviewed - No data to display ____________________________________________   PROCEDURES Procedures   INITIAL IMPRESSION / ASSESSMENT AND PLAN / ED COURSE  Pertinent labs & imaging results that were available during my care of the patient were reviewed by me and considered in my medical decision making (see chart for details).  Well-appearing patient.  No acute distress.  Left lower back pain, suspect sciatica and strain.  Will treat with prednisone and as needed Flexeril.  Encouraged rest, fluids, stretching, ice, heat and supportive care.  Discussed indication, risks and benefits of medications with patient. Discussed follow up and return parameters including no resolution or any worsening concerns. Patient verbalized understanding and agreed to plan.   ____________________________________________   FINAL CLINICAL IMPRESSION(S) / ED DIAGNOSES  Final diagnoses:  Acute left-sided low back pain with left-sided sciatica     ED Discharge Orders         Ordered    predniSONE (DELTASONE) 10 MG tablet     02/07/19 1147    cyclobenzaprine (FLEXERIL) 10 MG tablet  2 times daily PRN     02/07/19 1147           Note: This dictation was prepared with Dragon dictation along with smaller phrase technology. Any transcriptional errors that result from this process are unintentional.         Renford Dills,  NP 02/07/19 1155

## 2019-02-07 NOTE — ED Triage Notes (Signed)
Pt presents with LBP on the left side that started yesterday. He does report LBP for several years. He states he does have a slipped disc. He does tile work for a living and the other day when getting up off the floor he noticed an increase in his pain. He does report trouble walking, turning, bending and reaching down. He also reports the pain does radiate down his left leg. Pt did take Ibuprofen with little relief.
